# Patient Record
Sex: Female | Born: 1995 | Race: White | Hispanic: No | Smoking: Never smoker
Health system: Southern US, Community
[De-identification: ages and names within clinical notes are randomized; demographics above are authoritative.]

## PROBLEM LIST (undated history)

## (undated) DIAGNOSIS — F419 Anxiety disorder, unspecified: Secondary | ICD-10-CM

## (undated) DIAGNOSIS — F32A Depression, unspecified: Secondary | ICD-10-CM

## (undated) DIAGNOSIS — F909 Attention-deficit hyperactivity disorder, unspecified type: Secondary | ICD-10-CM

## (undated) HISTORY — PX: ELBOW SURGERY: SHX618

---

## 2022-05-29 ENCOUNTER — Emergency Department (HOSPITAL_BASED_OUTPATIENT_CLINIC_OR_DEPARTMENT_OTHER): Payer: Self-pay

## 2022-05-29 ENCOUNTER — Emergency Department (HOSPITAL_BASED_OUTPATIENT_CLINIC_OR_DEPARTMENT_OTHER)
Admission: EM | Admit: 2022-05-29 | Discharge: 2022-05-30 | Disposition: A | Discharge status: Home / Self Care | Payer: Self-pay | Attending: Emergency Medicine | Admitting: Emergency Medicine

## 2022-05-29 ENCOUNTER — Other Ambulatory Visit: Payer: Self-pay

## 2022-05-29 ENCOUNTER — Encounter (HOSPITAL_BASED_OUTPATIENT_CLINIC_OR_DEPARTMENT_OTHER): Payer: Self-pay

## 2022-05-29 DIAGNOSIS — W19XXXA Unspecified fall, initial encounter: Secondary | ICD-10-CM

## 2022-05-29 DIAGNOSIS — M25572 Pain in left ankle and joints of left foot: Secondary | ICD-10-CM | POA: Insufficient documentation

## 2022-05-29 DIAGNOSIS — W109XXA Fall (on) (from) unspecified stairs and steps, initial encounter: Secondary | ICD-10-CM | POA: Insufficient documentation

## 2022-05-29 DIAGNOSIS — M79605 Pain in left leg: Secondary | ICD-10-CM

## 2022-05-29 DIAGNOSIS — M25562 Pain in left knee: Secondary | ICD-10-CM | POA: Insufficient documentation

## 2022-05-29 HISTORY — DX: Anxiety disorder, unspecified: F41.9

## 2022-05-29 HISTORY — DX: Depression, unspecified: F32.A

## 2022-05-29 HISTORY — DX: Attention-deficit hyperactivity disorder, unspecified type: F90.9

## 2022-05-29 MED ORDER — OXYCODONE-ACETAMINOPHEN 5-325 MG PO TABS
1.0000 | ORAL_TABLET | ORAL | Status: DC | PRN
  Administered 2022-05-29: 1 via ORAL
  Filled 2022-05-29: qty 1

## 2022-05-29 NOTE — ED Provider Notes (Signed)
[facility] EMERGENCY DEPARTMENT Provider Note   CSN: [id number] Arrival date & time: 05/29/22  2102     History {Add pertinent medical, surgical, social history, OB history to HPI:1} Chief Complaint  Patient presents with   Evelyn Diaz is a 26 y.o. female.  Patient is a 26 year old female presenting for fall.  Patient admits to carrying a small child on the stairs when she tripped and fell down approximately 5 steps.  Denies any head trauma or LOC.  Admits to pain in the left knee and ankle.  Denies any swelling or bruising.  The history is provided by the patient. No language interpreter was used.  Fall Pertinent negatives include no chest pain, no abdominal pain and no shortness of breath.      Home Medications Prior to Admission medications   Not on File      Allergies    Sulfa antibiotics and Iodine    Review of Systems   Review of Systems  Constitutional:  Negative for chills and fever.  HENT:  Negative for ear pain and sore throat.   Eyes:  Negative for pain and visual disturbance.  Respiratory:  Negative for cough and shortness of breath.   Cardiovascular:  Negative for chest pain and palpitations.  Gastrointestinal:  Negative for abdominal pain and vomiting.  Genitourinary:  Negative for dysuria and hematuria.  Musculoskeletal:  Negative for arthralgias and back pain.  Skin:  Negative for color change and rash.  Neurological:  Negative for seizures and syncope.  All other systems reviewed and are negative.  Physical Exam Updated Vital Signs BP (!) 131/91 (BP Location: Left Arm)   Pulse 96   Temp 99.3 F (37.4 C) (Oral)   Resp 20   Ht 5\' 2"  (1.575 m)   Wt 72.6 kg   SpO2 100%   BMI 29.26 kg/m  Physical Exam Vitals and nursing note reviewed.  Constitutional:      General: She is not in acute distress.    Appearance: She is well-developed.  HENT:     Head: Normocephalic and atraumatic.  Eyes:     Conjunctiva/sclera:  Conjunctivae normal.  Cardiovascular:     Rate and Rhythm: Normal rate and regular rhythm.     Heart sounds: No murmur heard. Pulmonary:     Effort: Pulmonary effort is normal. No respiratory distress.     Breath sounds: Normal breath sounds.  Abdominal:     Palpations: Abdomen is soft.     Tenderness: There is no abdominal tenderness.  Musculoskeletal:        General: No swelling.     Cervical back: Neck supple.     Right hip: Normal.     Left hip: Normal.     Right upper leg: Normal.     Left upper leg: Normal.     Left knee: Bony tenderness present.     Left lower leg: Bony tenderness present.     Left ankle: Tenderness present over the lateral malleolus.  Skin:    General: Skin is warm and dry.     Capillary Refill: Capillary refill takes less than 2 seconds.  Neurological:     Mental Status: She is alert.     GCS: GCS eye subscore is 4. GCS verbal subscore is 5. GCS motor subscore is 6.     Sensory: Sensation is intact.     Motor: Motor function is intact.  Psychiatric:        Mood and  Affect: Mood normal.    ED Results / Procedures / Treatments   Labs (all labs ordered are listed, but only abnormal results are displayed) Labs Reviewed - No data to display  EKG None  Radiology DG Ankle Complete Left  Result Date: 05/29/2022 CLINICAL DATA:  Fall with injury. EXAM: LEFT ANKLE COMPLETE - 3+ VIEW COMPARISON:  None Available. FINDINGS: There is no evidence of fracture, dislocation, or joint effusion. There is no evidence of arthropathy or other focal bone abnormality. Soft tissues are unremarkable. IMPRESSION: Negative. Electronically Signed   By: Larose Hires D.O.   On: 05/29/2022 21:44   DG Knee Complete 4 Views Left  Result Date: 05/29/2022 CLINICAL DATA:  Fall with injury. EXAM: LEFT KNEE - COMPLETE 4+ VIEW COMPARISON:  None Available. FINDINGS: No evidence of fracture, dislocation, or joint effusion. No evidence of arthropathy or other focal bone abnormality. Soft  tissues are unremarkable. IMPRESSION: Negative. Electronically Signed   By: Larose Hires D.O.   On: 05/29/2022 21:43    Procedures Procedures  {Document cardiac monitor, telemetry assessment procedure when appropriate:1}  Medications Ordered in ED Medications  oxyCODONE-acetaminophen (PERCOCET/ROXICET) 5-325 MG per tablet 1 tablet (1 tablet Oral Given 05/29/22 2120)    ED Course/ Medical Decision Making/ A&P                           Medical Decision Making Amount and/or Complexity of Data Reviewed Radiology: ordered.  Risk Prescription drug management.  62:17 PM 26 year old female presenting for fall.  Is alert and oriented x3, no acute distress, afebrile, stable vital signs.  Physical exam demonstrates no midline spinal tenderness.  Patient neurovascular intact.  Tenderness palpation of left tibia, lateral malleolus, and patella.  No gross deformities.  Ecchymosis or swelling.  Trays demonstrate no acute process  {Document critical care time when appropriate:1} {Document review of labs and clinical decision tools ie heart score, Chads2Vasc2 etc:1}  {Document your independent review of radiology images, and any outside records:1} {Document your discussion with family members, caretakers, and with consultants:1} {Document social determinants of health affecting pt's care:1} {Document your decision making why or why not admission, treatments were needed:1} Final Clinical Impression(s) / ED Diagnoses Final diagnoses:  None    Rx / DC Orders ED Discharge Orders     None

## 2022-05-29 NOTE — ED Triage Notes (Signed)
Pt was carrying a child down some stairs and tripped down approx 5 stairs. Pt with severe pain to L ankle. Pt also c/o pain to L knee.

## 2022-05-29 NOTE — Discharge Instructions (Signed)
X-ray demonstrates no acute fractures.  Please rest, elevate foot, ice, Motrin and Tylenol for pain.  Crutches given in emergency department.  Use if unable to tolerate ambulating.

## 2022-07-05 IMAGING — DX DG KNEE COMPLETE 4+V*L*
4 series · 4 of 4 positions shown · non-contrast
Comparison: None Available.

CLINICAL DATA: Fall with injury.

EXAM:
LEFT KNEE - COMPLETE 4+ VIEW

[knee ap]
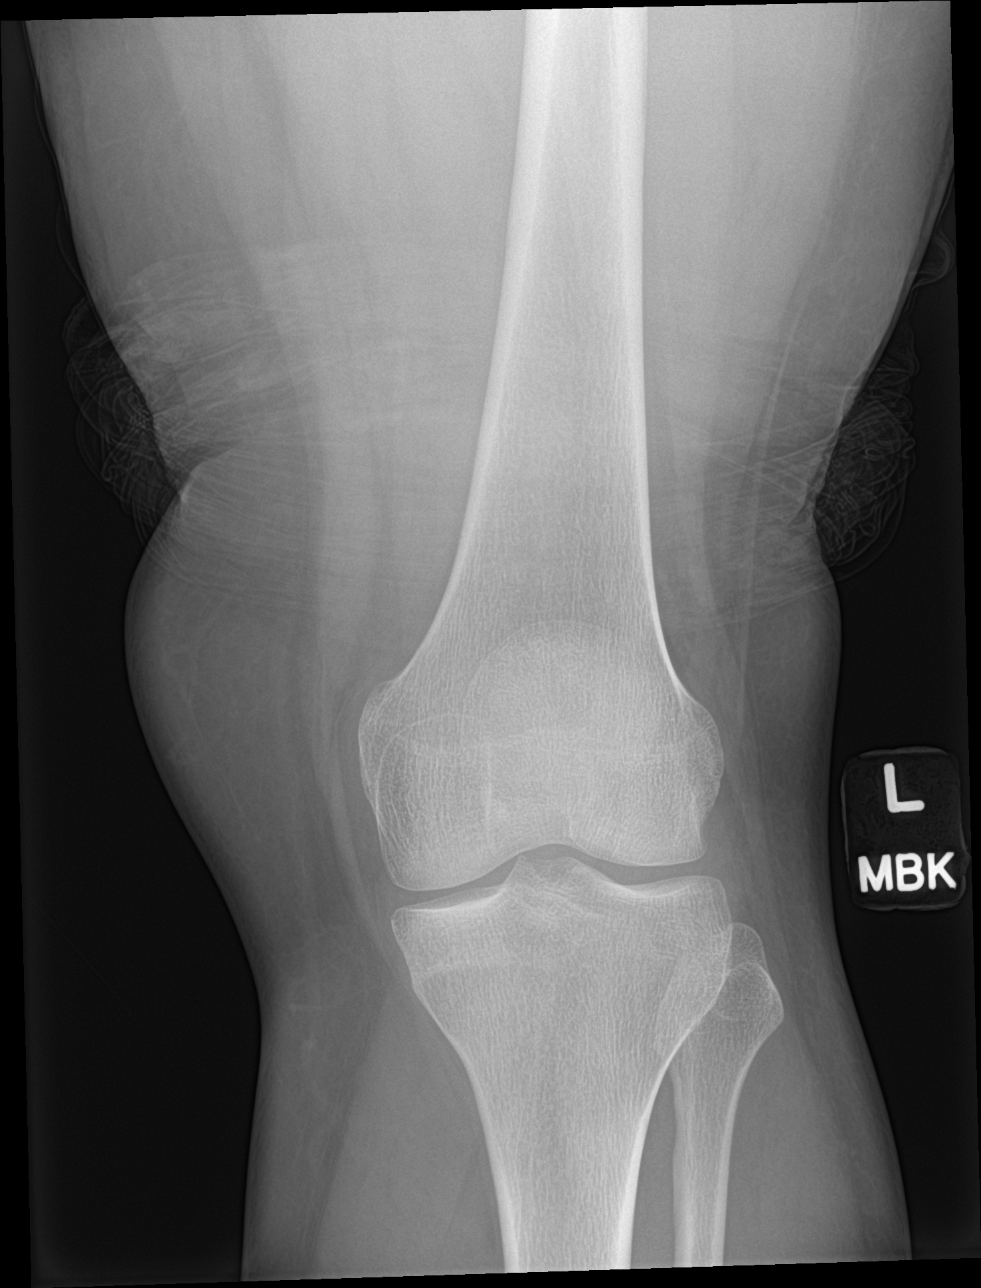

[knee lat]
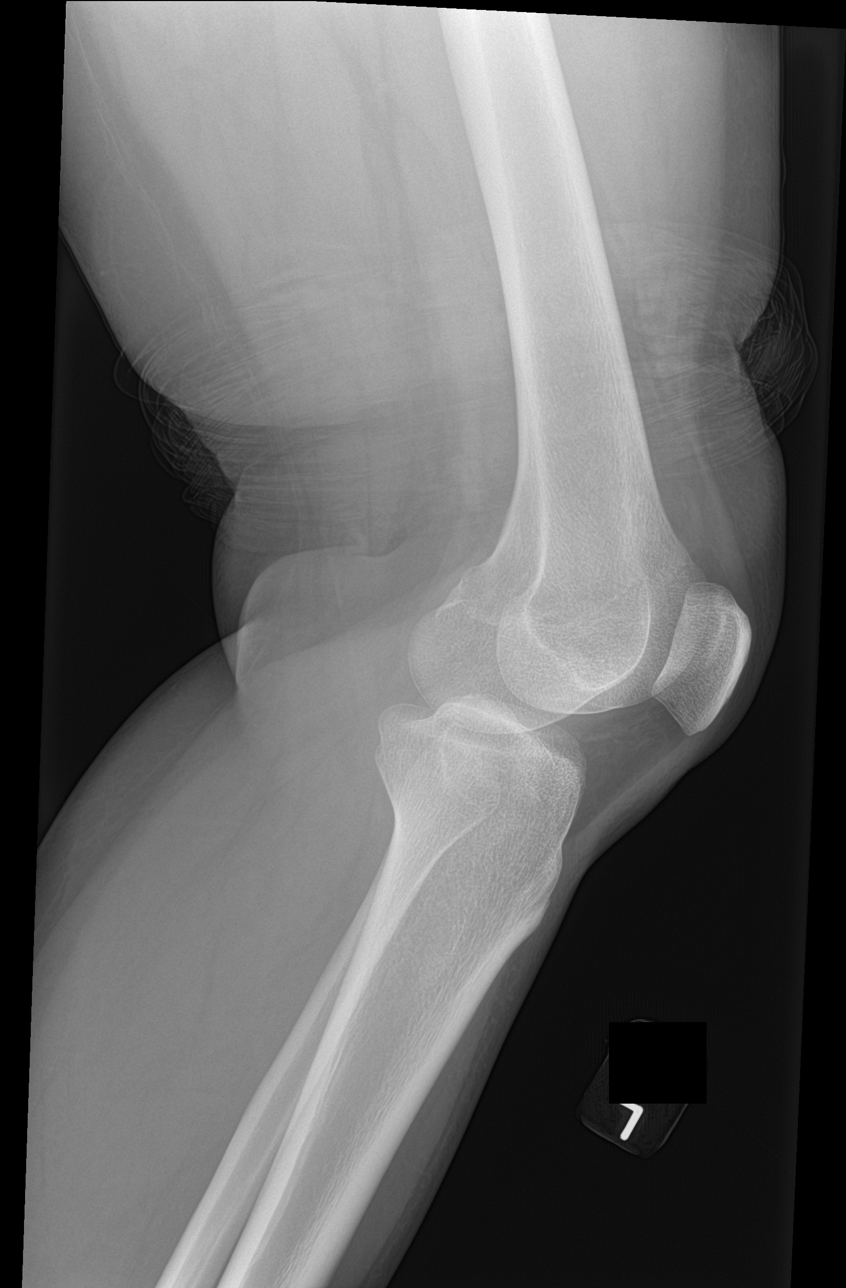

[knee obl (1 of 2)]
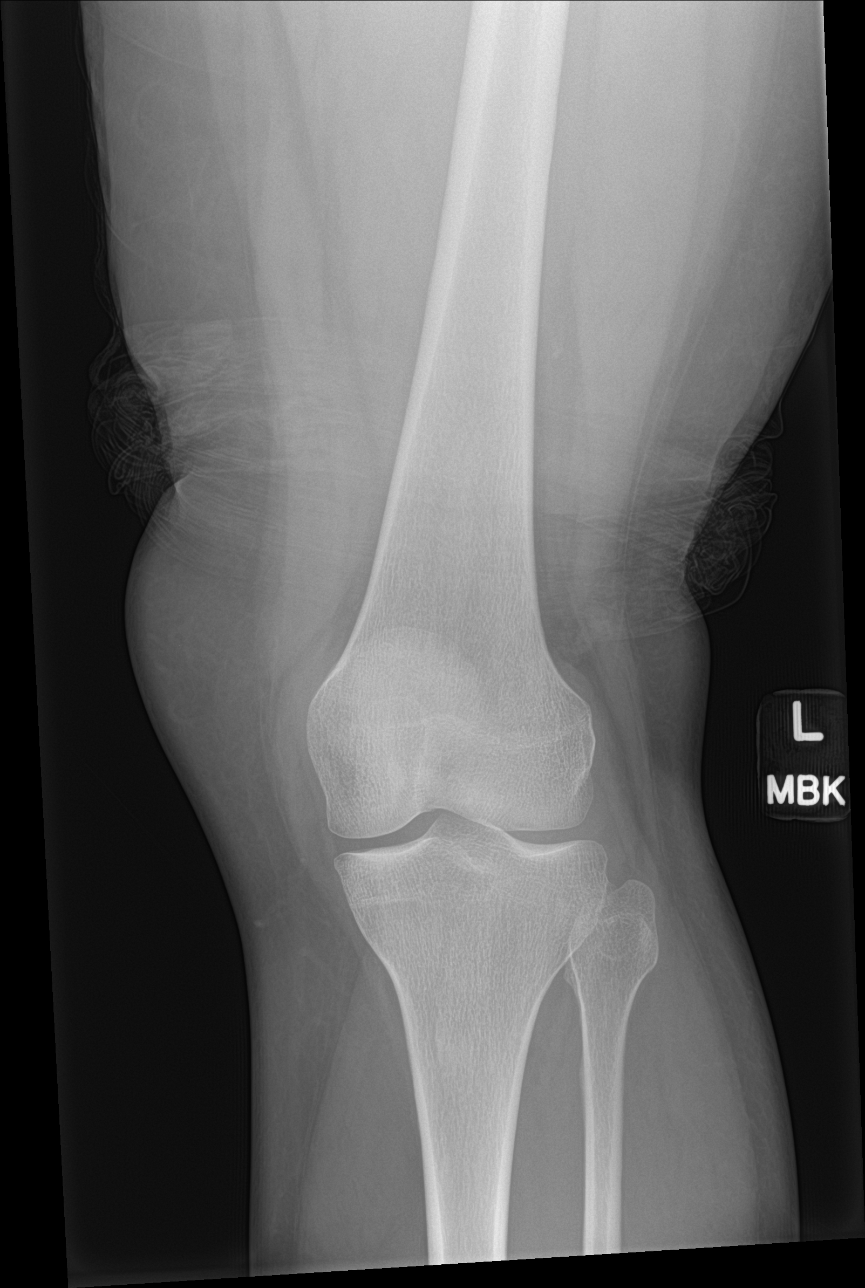

[knee obl (2 of 2)]
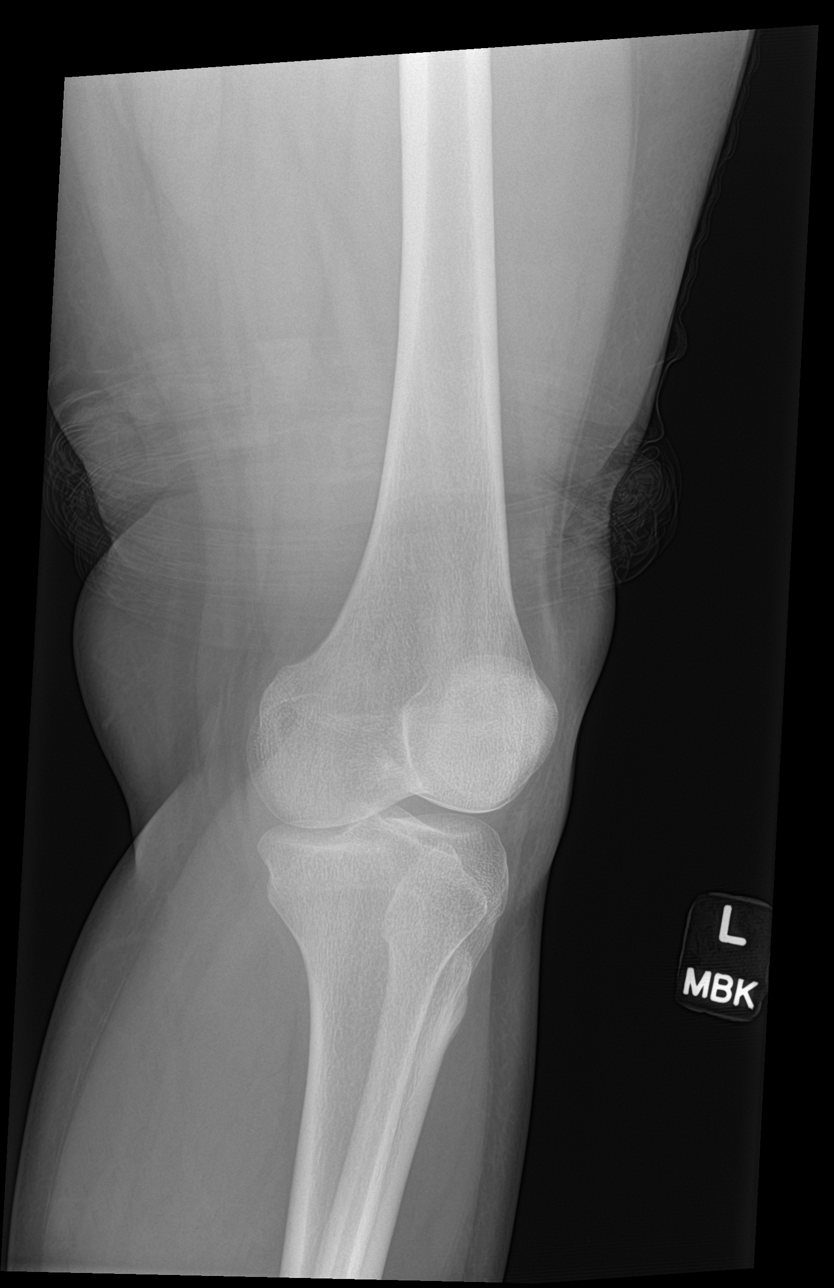

[4 of 4 positions shown; findings below may reference images not displayed]

FINDINGS: No evidence of fracture, dislocation, or joint effusion. No evidence
of arthropathy or other focal bone abnormality. Soft tissues are
unremarkable.
IMPRESSION: Negative.

## 2024-06-21 NOTE — Telephone Encounter (Signed)
 Please review

## 2024-06-25 NOTE — Telephone Encounter (Signed)
 FYI
